# Patient Record
Sex: Male | Born: 1975
Health system: Southern US, Community
[De-identification: ages and names within clinical notes are randomized; demographics above are authoritative.]

## PROBLEM LIST (undated history)

## (undated) DIAGNOSIS — I10 Essential (primary) hypertension: Secondary | ICD-10-CM

---

## 2018-07-30 ENCOUNTER — Encounter (HOSPITAL_COMMUNITY): Payer: Self-pay | Admitting: *Deleted

## 2018-07-30 ENCOUNTER — Other Ambulatory Visit: Payer: Self-pay

## 2018-07-30 ENCOUNTER — Emergency Department (HOSPITAL_COMMUNITY): Payer: BLUE CROSS/BLUE SHIELD

## 2018-07-30 ENCOUNTER — Emergency Department (HOSPITAL_COMMUNITY)
Admission: EM | Admit: 2018-07-30 | Discharge: 2018-07-30 | Disposition: A | Discharge status: Home / Self Care | Payer: BLUE CROSS/BLUE SHIELD | Attending: Emergency Medicine | Admitting: Emergency Medicine

## 2018-07-30 DIAGNOSIS — S20211A Contusion of right front wall of thorax, initial encounter: Secondary | ICD-10-CM | POA: Diagnosis not present

## 2018-07-30 DIAGNOSIS — I10 Essential (primary) hypertension: Secondary | ICD-10-CM | POA: Insufficient documentation

## 2018-07-30 DIAGNOSIS — Y999 Unspecified external cause status: Secondary | ICD-10-CM | POA: Insufficient documentation

## 2018-07-30 DIAGNOSIS — Y9389 Activity, other specified: Secondary | ICD-10-CM | POA: Insufficient documentation

## 2018-07-30 DIAGNOSIS — Y929 Unspecified place or not applicable: Secondary | ICD-10-CM | POA: Insufficient documentation

## 2018-07-30 HISTORY — DX: Essential (primary) hypertension: I10

## 2018-07-30 MED ORDER — IBUPROFEN 800 MG PO TABS: 800.0000 mg | ORAL_TABLET | Freq: Three times a day (TID) | ORAL | 0 refills | Status: AC
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
## 2018-07-30 NOTE — ED Notes (Signed)
Pt stable, ambulatory, states understanding of discharge instructions 

## 2020-03-18 ENCOUNTER — Emergency Department (HOSPITAL_COMMUNITY): Payer: Self-pay

## 2020-03-18 ENCOUNTER — Ambulatory Visit (HOSPITAL_COMMUNITY)
Admission: EM | Admit: 2020-03-18 | Discharge: 2020-03-18 | Disposition: A | Discharge status: Home / Self Care | Payer: Self-pay | Attending: Emergency Medicine | Admitting: Emergency Medicine

## 2020-03-18 ENCOUNTER — Encounter (HOSPITAL_COMMUNITY)
Admission: EM | Disposition: A | Discharge status: Home / Self Care | Payer: Self-pay | Source: Home / Self Care | Attending: Emergency Medicine

## 2020-03-18 ENCOUNTER — Other Ambulatory Visit: Payer: Self-pay

## 2020-03-18 ENCOUNTER — Emergency Department (HOSPITAL_COMMUNITY): Payer: Self-pay | Admitting: Anesthesiology

## 2020-03-18 ENCOUNTER — Encounter (HOSPITAL_COMMUNITY): Payer: Self-pay | Admitting: Certified Registered Nurse Anesthetist

## 2020-03-18 ENCOUNTER — Emergency Department (HOSPITAL_COMMUNITY): Payer: BLUE CROSS/BLUE SHIELD

## 2020-03-18 ENCOUNTER — Emergency Department (HOSPITAL_COMMUNITY)
Admission: EM | Admit: 2020-03-18 | Discharge: 2020-03-18 | Disposition: A | Discharge status: Home / Self Care | Payer: Self-pay | Attending: Emergency Medicine | Admitting: Emergency Medicine

## 2020-03-18 DIAGNOSIS — F10920 Alcohol use, unspecified with intoxication, uncomplicated: Secondary | ICD-10-CM | POA: Insufficient documentation

## 2020-03-18 DIAGNOSIS — Y93I9 Activity, other involving external motion: Secondary | ICD-10-CM | POA: Insufficient documentation

## 2020-03-18 DIAGNOSIS — Y92410 Unspecified street and highway as the place of occurrence of the external cause: Secondary | ICD-10-CM | POA: Insufficient documentation

## 2020-03-18 DIAGNOSIS — Y906 Blood alcohol level of 120-199 mg/100 ml: Secondary | ICD-10-CM | POA: Insufficient documentation

## 2020-03-18 DIAGNOSIS — Z20822 Contact with and (suspected) exposure to covid-19: Secondary | ICD-10-CM | POA: Insufficient documentation

## 2020-03-18 DIAGNOSIS — I10 Essential (primary) hypertension: Secondary | ICD-10-CM | POA: Insufficient documentation

## 2020-03-18 DIAGNOSIS — R519 Headache, unspecified: Secondary | ICD-10-CM | POA: Insufficient documentation

## 2020-03-18 DIAGNOSIS — S21212A Laceration without foreign body of left back wall of thorax without penetration into thoracic cavity, initial encounter: Secondary | ICD-10-CM | POA: Insufficient documentation

## 2020-03-18 DIAGNOSIS — R791 Abnormal coagulation profile: Secondary | ICD-10-CM | POA: Insufficient documentation

## 2020-03-18 DIAGNOSIS — Z79899 Other long term (current) drug therapy: Secondary | ICD-10-CM | POA: Insufficient documentation

## 2020-03-18 DIAGNOSIS — Y999 Unspecified external cause status: Secondary | ICD-10-CM | POA: Insufficient documentation

## 2020-03-18 DIAGNOSIS — T148XXA Other injury of unspecified body region, initial encounter: Secondary | ICD-10-CM

## 2020-03-18 DIAGNOSIS — T1490XA Injury, unspecified, initial encounter: Secondary | ICD-10-CM

## 2020-03-18 HISTORY — PX: WOUND EXPLORATION: SHX6188

## 2020-03-18 LAB — COMPREHENSIVE METABOLIC PANEL
ALT: 85 U/L — ABNORMAL HIGH (ref 0–44)
ALT: 74 U/L — ABNORMAL HIGH (ref 0–44)
AST: 89 U/L — ABNORMAL HIGH (ref 15–41)
AST: 68 U/L — ABNORMAL HIGH (ref 15–41)
Albumin: 4.2 g/dL (ref 3.5–5.0)
Albumin: 3.9 g/dL (ref 3.5–5.0)
Alkaline Phosphatase: 37 U/L — ABNORMAL LOW (ref 38–126)
Alkaline Phosphatase: 33 U/L — ABNORMAL LOW (ref 38–126)
Anion gap: 12 (ref 5–15)
Anion gap: 13 (ref 5–15)
BUN: 14 mg/dL (ref 6–20)
BUN: 12 mg/dL (ref 6–20)
CO2: 20 mmol/L — ABNORMAL LOW (ref 22–32)
CO2: 19 mmol/L — ABNORMAL LOW (ref 22–32)
Calcium: 9.2 mg/dL (ref 8.9–10.3)
Calcium: 8.8 mg/dL — ABNORMAL LOW (ref 8.9–10.3)
Chloride: 107 mmol/L (ref 98–111)
Chloride: 108 mmol/L (ref 98–111)
Creatinine, Ser: 1.17 mg/dL (ref 0.61–1.24)
Creatinine, Ser: 0.99 mg/dL (ref 0.61–1.24)
GFR calc Af Amer: >60 mL/min (ref >60)
GFR calc Af Amer: >60 mL/min (ref >60)
GFR calc non Af Amer: >60 mL/min (ref >60)
GFR calc non Af Amer: >60 mL/min (ref >60)
Glucose, Bld: 124 mg/dL — ABNORMAL HIGH (ref 70–99)
Glucose, Bld: 123 mg/dL — ABNORMAL HIGH (ref 70–99)
Potassium: 3.4 mmol/L — ABNORMAL LOW (ref 3.5–5.1)
Potassium: 3.6 mmol/L (ref 3.5–5.1)
Sodium: 139 mmol/L (ref 135–145)
Sodium: 140 mmol/L (ref 135–145)
Total Bilirubin: 0.8 mg/dL (ref 0.3–1.2)
Total Bilirubin: 0.8 mg/dL (ref 0.3–1.2)
Total Protein: 7.1 g/dL (ref 6.5–8.1)
Total Protein: 6.6 g/dL (ref 6.5–8.1)

## 2020-03-18 LAB — I-STAT CHEM 8, ED
BUN: 16 mg/dL (ref 6–20)
BUN: 11 mg/dL (ref 6–20)
Calcium, Ion: 1.09 mmol/L — ABNORMAL LOW (ref 1.15–1.40)
Calcium, Ion: 0.96 mmol/L — ABNORMAL LOW (ref 1.15–1.40)
Chloride: 109 mmol/L (ref 98–111)
Chloride: 109 mmol/L (ref 98–111)
Creatinine, Ser: 1.3 mg/dL — ABNORMAL HIGH (ref 0.61–1.24)
Creatinine, Ser: 1 mg/dL (ref 0.61–1.24)
Glucose, Bld: 124 mg/dL — ABNORMAL HIGH (ref 70–99)
Glucose, Bld: 120 mg/dL — ABNORMAL HIGH (ref 70–99)
HCT: 45 % (ref 39.0–52.0)
HCT: 40 % (ref 39.0–52.0)
Hemoglobin: 15.3 g/dL (ref 13.0–17.0)
Hemoglobin: 13.6 g/dL (ref 13.0–17.0)
Potassium: 3.4 mmol/L — ABNORMAL LOW (ref 3.5–5.1)
Potassium: 3.5 mmol/L (ref 3.5–5.1)
Sodium: 144 mmol/L (ref 135–145)
Sodium: 142 mmol/L (ref 135–145)
TCO2: 22 mmol/L (ref 22–32)
TCO2: 18 mmol/L — ABNORMAL LOW (ref 22–32)

## 2020-03-18 LAB — POCT I-STAT 7, (LYTES, BLD GAS, ICA,H+H)
Acid-base deficit: 4 mmol/L — ABNORMAL HIGH (ref 0.0–2.0)
Acid-base deficit: 1 mmol/L (ref 0.0–2.0)
Bicarbonate: 23.1 mmol/L (ref 20.0–28.0)
Bicarbonate: 24.4 mmol/L (ref 20.0–28.0)
Calcium, Ion: 1.16 mmol/L (ref 1.15–1.40)
Calcium, Ion: 1.11 mmol/L — ABNORMAL LOW (ref 1.15–1.40)
HCT: 31 % — ABNORMAL LOW (ref 39.0–52.0)
HCT: 26 % — ABNORMAL LOW (ref 39.0–52.0)
Hemoglobin: 10.5 g/dL — ABNORMAL LOW (ref 13.0–17.0)
Hemoglobin: 8.8 g/dL — ABNORMAL LOW (ref 13.0–17.0)
O2 Saturation: 100 %
O2 Saturation: 100 %
Potassium: 4.2 mmol/L (ref 3.5–5.1)
Potassium: 4.1 mmol/L (ref 3.5–5.1)
Sodium: 142 mmol/L (ref 135–145)
Sodium: 144 mmol/L (ref 135–145)
TCO2: 25 mmol/L (ref 22–32)
TCO2: 26 mmol/L (ref 22–32)
pCO2 arterial: 51 mmHg — ABNORMAL HIGH (ref 32.0–48.0)
pCO2 arterial: 41.2 mmHg (ref 32.0–48.0)
pH, Arterial: 7.264 — ABNORMAL LOW (ref 7.350–7.450)
pH, Arterial: 7.381 (ref 7.350–7.450)
pO2, Arterial: 474 mmHg — ABNORMAL HIGH (ref 83.0–108.0)
pO2, Arterial: 190 mmHg — ABNORMAL HIGH (ref 83.0–108.0)

## 2020-03-18 LAB — CBC
HCT: 43.1 % (ref 39.0–52.0)
HCT: 39.6 % (ref 39.0–52.0)
Hemoglobin: 14.7 g/dL (ref 13.0–17.0)
Hemoglobin: 13.2 g/dL (ref 13.0–17.0)
MCH: 31.1 pg (ref 26.0–34.0)
MCH: 30.8 pg (ref 26.0–34.0)
MCHC: 34.1 g/dL (ref 30.0–36.0)
MCHC: 33.3 g/dL (ref 30.0–36.0)
MCV: 91.3 fL (ref 80.0–100.0)
MCV: 92.5 fL (ref 80.0–100.0)
Platelets: 325 10*3/uL (ref 150–400)
Platelets: 305 10*3/uL (ref 150–400)
RBC: 4.72 MIL/uL (ref 4.22–5.81)
RBC: 4.28 MIL/uL (ref 4.22–5.81)
RDW: 12.4 % (ref 11.5–15.5)
RDW: 12.6 % (ref 11.5–15.5)
WBC: 4.7 10*3/uL (ref 4.0–10.5)
WBC: 4.5 10*3/uL (ref 4.0–10.5)
nRBC: 0 % (ref 0.0–0.2)
nRBC: 0 % (ref 0.0–0.2)

## 2020-03-18 LAB — LACTIC ACID, PLASMA: Lactic Acid, Venous: 2.1 mmol/L (ref 0.5–1.9)

## 2020-03-18 LAB — ETHANOL
Alcohol, Ethyl (B): 167 mg/dL — ABNORMAL HIGH (ref <10)
Alcohol, Ethyl (B): 26 mg/dL — ABNORMAL HIGH (ref <10)

## 2020-03-18 LAB — PROTIME-INR
INR: 1 (ref 0.8–1.2)
INR: 1.1 (ref 0.8–1.2)
Prothrombin Time: 12.5 seconds (ref 11.4–15.2)
Prothrombin Time: 13.3 seconds (ref 11.4–15.2)

## 2020-03-18 LAB — SAMPLE TO BLOOD BANK

## 2020-03-18 LAB — SARS CORONAVIRUS 2 BY RT PCR (HOSPITAL ORDER, PERFORMED IN ~~LOC~~ HOSPITAL LAB): SARS Coronavirus 2: NEGATIVE

## 2020-03-18 LAB — ABO/RH: ABO/RH(D): AB POS

## 2020-03-18 SURGERY — WOUND EXPLORATION
Anesthesia: General | Site: Back

## 2020-03-18 MED ORDER — ACETAMINOPHEN 10 MG/ML IV SOLN: 1000.0000 mg | Freq: Once | INTRAVENOUS | Status: DC | PRN

## 2020-03-18 MED ORDER — MIDAZOLAM HCL 2 MG/2ML IJ SOLN
INTRAMUSCULAR | Status: AC
  Filled 2020-03-18: qty 2

## 2020-03-18 MED ORDER — CEFAZOLIN SODIUM-DEXTROSE 2-4 GM/100ML-% IV SOLN
2.0000 g | Freq: Once | INTRAVENOUS | Status: AC
  Administered 2020-03-18: 2 g via INTRAVENOUS

## 2020-03-18 MED ORDER — SUCCINYLCHOLINE CHLORIDE 200 MG/10ML IV SOSY
PREFILLED_SYRINGE | INTRAVENOUS | Status: DC | PRN
  Administered 2020-03-18: 120 mg via INTRAVENOUS

## 2020-03-18 MED ORDER — LIDOCAINE 2% (20 MG/ML) 5 ML SYRINGE
INTRAMUSCULAR | Status: DC | PRN
  Administered 2020-03-18: 60 mg via INTRAVENOUS

## 2020-03-18 MED ORDER — PROPOFOL 10 MG/ML IV BOLUS
INTRAVENOUS | Status: AC
  Filled 2020-03-18: qty 20

## 2020-03-18 MED ORDER — OXYCODONE HCL 5 MG PO TABS: 5.0000 mg | ORAL_TABLET | Freq: Once | ORAL | Status: DC | PRN

## 2020-03-18 MED ORDER — PROMETHAZINE HCL 25 MG/ML IJ SOLN: 6.2500 mg | INTRAMUSCULAR | Status: DC | PRN

## 2020-03-18 MED ORDER — OXYCODONE HCL 5 MG/5ML PO SOLN: 5.0000 mg | Freq: Once | ORAL | Status: DC | PRN

## 2020-03-18 MED ORDER — KETOROLAC TROMETHAMINE 30 MG/ML IJ SOLN
30.0000 mg | Freq: Once | INTRAMUSCULAR | Status: AC
  Administered 2020-03-18: 30 mg via INTRAVENOUS

## 2020-03-18 MED ORDER — DEXAMETHASONE SODIUM PHOSPHATE 10 MG/ML IJ SOLN
INTRAMUSCULAR | Status: AC
  Filled 2020-03-18: qty 1

## 2020-03-18 MED ORDER — FENTANYL CITRATE (PF) 100 MCG/2ML IJ SOLN
INTRAMUSCULAR | Status: AC | PRN
  Administered 2020-03-18: 50 ug via INTRAVENOUS

## 2020-03-18 MED ORDER — ROCURONIUM BROMIDE 10 MG/ML (PF) SYRINGE
PREFILLED_SYRINGE | INTRAVENOUS | Status: AC
  Filled 2020-03-18: qty 10

## 2020-03-18 MED ORDER — LABETALOL HCL 5 MG/ML IV SOLN
5.0000 mg | INTRAVENOUS | Status: DC | PRN
  Administered 2020-03-18 (×3): 5 mg via INTRAVENOUS

## 2020-03-18 MED ORDER — ALBUMIN HUMAN 5 % IV SOLN: INTRAVENOUS | Status: DC | PRN

## 2020-03-18 MED ORDER — SODIUM CHLORIDE 0.9 % IV SOLN
INTRAVENOUS | Status: AC | PRN
  Administered 2020-03-18: 1000 mL via INTRAVENOUS

## 2020-03-18 MED ORDER — KETOROLAC TROMETHAMINE 30 MG/ML IJ SOLN
INTRAMUSCULAR | Status: AC
  Filled 2020-03-18: qty 1

## 2020-03-18 MED ORDER — SODIUM BICARBONATE 8.4 % IV SOLN
INTRAVENOUS | Status: AC
  Filled 2020-03-18: qty 50

## 2020-03-18 MED ORDER — SUCCINYLCHOLINE CHLORIDE 200 MG/10ML IV SOSY
PREFILLED_SYRINGE | INTRAVENOUS | Status: AC
  Filled 2020-03-18: qty 10

## 2020-03-18 MED ORDER — IOHEXOL 300 MG/ML  SOLN
75.0000 mL | Freq: Once | INTRAMUSCULAR | Status: AC | PRN
  Administered 2020-03-18: 75 mL via INTRAVENOUS

## 2020-03-18 MED ORDER — LABETALOL HCL 5 MG/ML IV SOLN
INTRAVENOUS | Status: AC
  Filled 2020-03-18: qty 4

## 2020-03-18 MED ORDER — OXYCODONE HCL 5 MG PO TABS: 5.0000 mg | ORAL_TABLET | Freq: Four times a day (QID) | ORAL | 0 refills | Status: AC | PRN

## 2020-03-18 MED ORDER — DEXAMETHASONE SODIUM PHOSPHATE 10 MG/ML IJ SOLN
INTRAMUSCULAR | Status: DC | PRN
  Administered 2020-03-18: 10 mg via INTRAVENOUS

## 2020-03-18 MED ORDER — LACTATED RINGERS IV SOLN: INTRAVENOUS | Status: DC | PRN

## 2020-03-18 MED ORDER — HYDROMORPHONE HCL 1 MG/ML IJ SOLN
INTRAMUSCULAR | Status: AC
  Administered 2020-03-18: 0.5 mg
  Filled 2020-03-18: qty 1

## 2020-03-18 MED ORDER — LISINOPRIL 10 MG PO TABS
10.0000 mg | ORAL_TABLET | Freq: Every day | ORAL | 0 refills | Status: AC
Start: 2020-03-18 — End: 2020-04-17

## 2020-03-18 MED ORDER — HYDROMORPHONE HCL 1 MG/ML IJ SOLN: 0.2500 mg | INTRAMUSCULAR | Status: DC | PRN

## 2020-03-18 MED ORDER — PROPOFOL 10 MG/ML IV BOLUS
INTRAVENOUS | Status: DC | PRN
  Administered 2020-03-18 (×2): 50 mg via INTRAVENOUS
  Administered 2020-03-18: 150 mg via INTRAVENOUS

## 2020-03-18 MED ORDER — ONDANSETRON HCL 4 MG/2ML IJ SOLN
INTRAMUSCULAR | Status: AC
  Filled 2020-03-18: qty 2

## 2020-03-18 MED ORDER — FENTANYL CITRATE (PF) 100 MCG/2ML IJ SOLN
INTRAMUSCULAR | Status: DC | PRN
  Administered 2020-03-18: 150 ug via INTRAVENOUS
  Administered 2020-03-18: 100 ug via INTRAVENOUS
  Administered 2020-03-18: 50 ug via INTRAVENOUS

## 2020-03-18 MED ORDER — ESMOLOL HCL 100 MG/10ML IV SOLN
INTRAVENOUS | Status: AC
  Filled 2020-03-18: qty 10

## 2020-03-18 MED ORDER — ONDANSETRON HCL 4 MG/2ML IJ SOLN
INTRAMUSCULAR | Status: DC | PRN
  Administered 2020-03-18: 4 mg via INTRAVENOUS

## 2020-03-18 MED ORDER — ROCURONIUM BROMIDE 10 MG/ML (PF) SYRINGE
PREFILLED_SYRINGE | INTRAVENOUS | Status: DC | PRN
  Administered 2020-03-18: 60 mg via INTRAVENOUS

## 2020-03-18 MED ORDER — SODIUM CHLORIDE 0.9 % IV BOLUS
1000.0000 mL | Freq: Once | INTRAVENOUS | Status: AC
  Administered 2020-03-18: 1000 mL via INTRAVENOUS

## 2020-03-18 MED ORDER — LIDOCAINE 2% (20 MG/ML) 5 ML SYRINGE
INTRAMUSCULAR | Status: AC
  Filled 2020-03-18: qty 5

## 2020-03-18 MED ORDER — SUGAMMADEX SODIUM 200 MG/2ML IV SOLN
INTRAVENOUS | Status: DC | PRN
  Administered 2020-03-18: 200 mg via INTRAVENOUS

## 2020-03-18 MED ORDER — 0.9 % SODIUM CHLORIDE (POUR BTL) OPTIME
TOPICAL | Status: DC | PRN
  Administered 2020-03-18: 1000 mL

## 2020-03-18 MED ORDER — TETANUS-DIPHTH-ACELL PERTUSSIS 5-2.5-18.5 LF-MCG/0.5 IM SUSP
0.5000 mL | Freq: Once | INTRAMUSCULAR | Status: AC
  Administered 2020-03-18: 0.5 mL via INTRAMUSCULAR

## 2020-03-18 MED ORDER — FENTANYL CITRATE (PF) 250 MCG/5ML IJ SOLN
INTRAMUSCULAR | Status: AC
  Filled 2020-03-18: qty 5

## 2020-03-18 MED ORDER — ESMOLOL HCL 100 MG/10ML IV SOLN
INTRAVENOUS | Status: DC | PRN
Start: 2020-03-18 — End: 2020-03-18
  Administered 2020-03-18: 30 mg via INTRAVENOUS
  Administered 2020-03-18: 20 mg via INTRAVENOUS

## 2020-03-18 MED ORDER — SODIUM BICARBONATE 8.4 % IV SOLN
INTRAVENOUS | Status: DC | PRN
  Administered 2020-03-18: 50 meq via INTRAVENOUS

## 2020-03-18 MED FILL — LISINOPRIL 10 MG TABS: 10 | 30 days supply | Qty: 30 | Fill #0

## 2020-03-18 MED FILL — oxyCODONE HCL 5 MG TABS: 5 | 4 days supply | Qty: 15 | Fill #0

## 2020-03-18 SURGICAL SUPPLY — 48 items
BLADE CLIPPER SURG (BLADE) IMPLANT
CANISTER SUCT 3000ML PPV (MISCELLANEOUS) ×3 IMPLANT
CHLORAPREP W/TINT 26 (MISCELLANEOUS) IMPLANT
COVER SURGICAL LIGHT HANDLE (MISCELLANEOUS) IMPLANT
DRAPE UNIVERSAL (DRAPES) ×6 IMPLANT
DRAPE WARM FLUID 44X44 (DRAPES) ×3 IMPLANT
DRSG OPSITE POSTOP 4X10 (GAUZE/BANDAGES/DRESSINGS) IMPLANT
DRSG OPSITE POSTOP 4X8 (GAUZE/BANDAGES/DRESSINGS) IMPLANT
ELECT BLADE 4.0 EZ CLEAN MEGAD (MISCELLANEOUS) ×3
ELECT BLADE 6.5 EXT (BLADE) IMPLANT
ELECT CAUTERY BLADE 6.4 (BLADE) ×3 IMPLANT
ELECT REM PT RETURN 9FT ADLT (ELECTROSURGICAL) ×3
ELECTRODE BLDE 4.0 EZ CLN MEGD (MISCELLANEOUS) ×2 IMPLANT
ELECTRODE REM PT RTRN 9FT ADLT (ELECTROSURGICAL) ×2 IMPLANT
GAUZE PACKING IODOFORM 1X5 (PACKING) ×3 IMPLANT
GAUZE SPONGE 4X4 12PLY STRL (GAUZE/BANDAGES/DRESSINGS) ×3 IMPLANT
GLOVE BIO SURGEON STRL SZ 6.5 (GLOVE) ×3 IMPLANT
GLOVE BIOGEL PI IND STRL 6 (GLOVE) ×2 IMPLANT
GLOVE BIOGEL PI INDICATOR 6 (GLOVE) ×1
GOWN STRL REUS W/ TWL LRG LVL3 (GOWN DISPOSABLE) ×4 IMPLANT
GOWN STRL REUS W/TWL LRG LVL3 (GOWN DISPOSABLE) ×2
HANDLE SUCTION POOLE (INSTRUMENTS) ×2 IMPLANT
KIT BASIN OR (CUSTOM PROCEDURE TRAY) ×3 IMPLANT
KIT TURNOVER KIT B (KITS) ×3 IMPLANT
LIGASURE IMPACT 36 18CM CVD LR (INSTRUMENTS) IMPLANT
NS IRRIG 1000ML POUR BTL (IV SOLUTION) ×6 IMPLANT
PACK GENERAL/GYN (CUSTOM PROCEDURE TRAY) ×3 IMPLANT
PAD ABD 7.5X8 STRL (GAUZE/BANDAGES/DRESSINGS) ×3 IMPLANT
PAD ARMBOARD 7.5X6 YLW CONV (MISCELLANEOUS) ×3 IMPLANT
PENCIL SMOKE EVACUATOR (MISCELLANEOUS) ×3 IMPLANT
SPONGE LAP 18X18 RF (DISPOSABLE) ×3 IMPLANT
STAPLER VISISTAT 35W (STAPLE) IMPLANT
SUCTION POOLE HANDLE (INSTRUMENTS) ×3
SUT ETHILON 2 0 FS 18 (SUTURE) ×9 IMPLANT
SUT PDS AB 1 TP1 54 (SUTURE) IMPLANT
SUT PDS AB 1 TP1 96 (SUTURE) IMPLANT
SUT SILK 2 0 SH CR/8 (SUTURE) ×3 IMPLANT
SUT SILK 2 0 TIES 10X30 (SUTURE) ×6 IMPLANT
SUT SILK 3 0 SH CR/8 (SUTURE) ×3 IMPLANT
SUT SILK 3 0 TIES 10X30 (SUTURE) ×6 IMPLANT
SUT VIC AB 2-0 CT1 27 (SUTURE) ×1
SUT VIC AB 2-0 CT1 TAPERPNT 27 (SUTURE) ×2 IMPLANT
SUT VIC AB 3-0 SH 18 (SUTURE) IMPLANT
TAPE CLOTH SURG 6X10 WHT LF (GAUZE/BANDAGES/DRESSINGS) ×3 IMPLANT
TOWEL GREEN STERILE (TOWEL DISPOSABLE) ×3 IMPLANT
TRAY FOLEY MTR SLVR 16FR STAT (SET/KITS/TRAYS/PACK) IMPLANT
TRAY FOLEY W/BAG SLVR 14FR (SET/KITS/TRAYS/PACK) ×3 IMPLANT
YANKAUER SUCT BULB TIP NO VENT (SUCTIONS) ×3 IMPLANT
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
## 2020-03-18 NOTE — Anesthesia Postprocedure Evaluation (Signed)
Anesthesia Post Note  Patient: Jesus Powell  Procedure(s) Performed: WOUND EXPLORATION (Left Back)     Patient location during evaluation: PACU Anesthesia Type: General Level of consciousness: awake Pain management: pain level controlled Vital Signs Assessment: post-procedure vital signs reviewed and stable Respiratory status: spontaneous breathing, nonlabored ventilation, respiratory function stable and patient connected to nasal cannula oxygen Cardiovascular status: blood pressure returned to baseline and stable Postop Assessment: no apparent nausea or vomiting Anesthetic complications: no   No complications documented.  Last Vitals:  Vitals:   03/18/20 1630 03/18/20 1645  BP: (!) 149/105 (!) 148/108  Pulse: 93 92  Resp: (!) 27 (!) 25  Temp:  (!) 36.3 C  SpO2: 99% 99%    Last Pain:  Vitals:   03/18/20 1630  TempSrc:   PainSc: 3                  Sejal Cofield P Oluwadamilola Rosamond

## 2020-03-18 NOTE — ED Notes (Signed)
Pt transported to CT ?
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
## 2020-03-18 NOTE — Transfer of Care (Signed)
Immediate Anesthesia Transfer of Care Note  Patient: Jesus Powell  Procedure(s) Performed: WOUND EXPLORATION (Left Back)  Patient Location: PACU  Anesthesia Type:General  Level of Consciousness: drowsy and patient cooperative  Airway & Oxygen Therapy: Patient Spontanous Breathing and Patient connected to face mask oxygen  Post-op Assessment: Report given to RN and Post -op Vital signs reviewed and stable  Post vital signs: Reviewed and stable  Last Vitals:  Vitals Value Taken Time  BP 160/119 03/18/20 1241  Temp 36.1 C 03/18/20 1239  Pulse 105 03/18/20 1245  Resp 20 03/18/20 1245  SpO2 95 % 03/18/20 1245  Vitals shown include unvalidated device data.  Last Pain:  Vitals:   03/18/20 1032  TempSrc: Tympanic  PainSc:          Complications: No complications documented.

## 2020-03-18 NOTE — Anesthesia Procedure Notes (Signed)
Arterial Line Insertion Start/End7/03/2020 10:44 AM Performed by: Rosalio Macadamia, CRNA, CRNA  Patient location: Pre-op. Preanesthetic checklist: patient identified, IV checked, site marked, risks and benefits discussed, surgical consent, monitors and equipment checked, pre-op evaluation, timeout performed and anesthesia consent Lidocaine 1% used for infiltration Left, radial was placed Catheter size: 20 G Hand hygiene performed  and maximum sterile barriers used   Attempts: 1 Procedure performed without using ultrasound guided technique. Following insertion, dressing applied and Biopatch. Post procedure assessment: normal and unchanged  Patient tolerated the procedure well with no immediate complications.
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
## 2020-03-18 NOTE — Discharge Instructions (Addendum)
Wound care: Change dry dressing daily and as needed for saturation. Remove packing strip on 03/20/2020. Ok to shower with wound open after removing packing. Call with concerns (ie increased drainage, redness, fever...). Follow up next week for wound check and suture removal.   Laceration Care, Adult A laceration is a cut that may go through all layers of the skin. The cut may also go into the tissue that is right under the skin. Some cuts heal on their own. Others need to be closed with stitches (sutures), staples, skin adhesive strips, or skin glue. Taking care of your injury lowers your risk of infection, helps your injury to heal better, and may prevent scarring. Supplies needed:  Soap.  Water.  Hand sanitizer.  Bandage (dressing).  Antibiotic ointment.  Clean towel. How to take care of your cut Wash your hands with soap and water before touching your wound or changing your bandage. If soap and water are not available, use hand sanitizer. If your doctor used stitches or staples:  Keep the wound clean and dry.  If you were given a bandage, change it at least once a day as told by your doctor. You should also change it if it gets wet or dirty.  Keep the wound completely dry for the first 24 hours, or as told by your doctor. After that, you may take a shower or a bath. Do not get the wound soaked in water until after the stitches or staples have been removed.  Clean the wound once a day, or as told by your doctor: ? Wash the wound with soap and water. ? Rinse the wound with water to remove all soap. ? Pat the wound dry with a clean towel. Do not rub the wound.  After you clean the wound, put a thin layer of antibiotic ointment on it as told by your doctor. This ointment: ? Helps to prevent infection. ? Keeps the bandage from sticking to the wound.  Have your stitches or staples removed as told by your doctor. If your doctor used skin adhesive strips:  Keep the wound clean and  dry.  If you were given a bandage, you should change it at least once a day as told by your doctor. You should also change it if it gets wet or dirty.  Do not get the skin adhesive strips wet. You can take a shower or a bath, but keep the wound dry.  If the wound gets wet, pat it dry with a clean towel. Do not rub the wound.  Skin adhesive strips fall off on their own. You can trim the strips as the wound heals. Do not remove any strips that are still stuck to the wound. They will fall off after a while. If your doctor used skin glue:  Try to keep your wound dry, but you may briefly wet it in the shower or bath. Do not soak the wound in water, such as by swimming.  After you take a shower or a bath, gently pat the wound dry with a clean towel. Do not rub the wound.  Do not do any activities that will make you really sweaty until the skin glue has fallen off on its own.  Do not apply liquid, cream, or ointment medicine to your wound while the skin glue is still on.  If you were given a bandage, you should change it at least once a day or as told by your doctor. You should also change it if it  gets dirty or wet.  If a bandage is placed over the wound, do not let the tape touch the skin glue.  Do not pick at the glue. The skin glue usually stays on for 5-10 days. Then, it falls off the skin. General instructions   Take over-the-counter and prescription medicines only as told by your doctor.  If you were given antibiotic medicine or ointment, take or apply it as told by your doctor. Do not stop using it even if your condition improves.  Do not scratch or pick at the wound.  Check your wound every day for signs of infection. Watch for: ? Redness, swelling, or pain. ? Fluid, blood, or pus.  Raise (elevate) the injured area above the level of your heart while you are sitting or lying down.  If directed, put ice on the affected area: ? Put ice in a plastic bag. ? Place a towel  between your skin and the bag. ? Leave the ice on for 20 minutes, 2-3 times a day.  Prevent scarring by covering your wound with sunscreen of at least 30 SPF whenever you are outside after your wound has healed.  Keep all follow-up visits as told by your doctor. This is important. Get help if:  You got a tetanus shot and you have any of these problems at the injection site: ? Swelling. ? Very bad pain. ? Redness. ? Bleeding.  You have a fever.  A wound that was closed breaks open.  You notice a bad smell coming from your wound or your bandage.  You notice something coming out of the wound, such as wood or glass.  Medicine does not relieve your pain.  You have more redness, swelling, or pain at the site of your wound.  You have fluid, blood, or pus coming from your wound.  You notice a change in the color of your skin near your wound.  You need to change the bandage often because fluid, blood, or pus is coming from the wound.  You start to have a new rash.  You start to have numbness around the wound. Get help right away if:  You have very bad swelling around the wound.  Your pain suddenly gets worse and is very bad.  You notice painful lumps near the wound or anywhere on your body.  You have a red streak going away from your wound.  The wound is on your hand or foot, and: ? You cannot move a finger or toe. ? Your fingers or toes look pale or bluish. Summary  A laceration is a cut that may go through all layers of the skin. The cut may also go into the tissue right under the skin.  Some cuts heal on their own. Others need to be closed with stitches, staples, skin adhesive strips, or skin glue.  Follow your doctor's instructions for caring for your cut. Proper care of a cut lowers the risk of infection, helps the cut heal better, and prevents scarring. This information is not intended to replace advice given to you by your health care provider. Make sure you  discuss any questions you have with your health care provider. Document Revised: 10/27/2017 Document Reviewed: 09/18/2017 Elsevier Patient Education  2020 [employer].    Hypertension, Adult Hypertension is another name for high blood pressure. High blood pressure forces your heart to work harder to pump blood. This can cause problems over time. There are two numbers in a blood pressure reading. There is  a top number (systolic) over a bottom number (diastolic). It is best to have a blood pressure that is below 120/80. Healthy choices can help lower your blood pressure, or you may need medicine to help lower it. What are the causes? The cause of this condition is not known. Some conditions may be related to high blood pressure. What increases the risk?  Smoking.  Having type 2 diabetes mellitus, high cholesterol, or both.  Not getting enough exercise or physical activity.  Being overweight.  Having too much fat, sugar, calories, or salt (sodium) in your diet.  Drinking too much alcohol.  Having long-term (chronic) kidney disease.  Having a family history of high blood pressure.  Age. Risk increases with age.  Race. You may be at higher risk if you are African American.  Gender. Men are at higher risk than women before age 8. After age 53, women are at higher risk than men.  Having obstructive sleep apnea.  Stress. What are the signs or symptoms?  High blood pressure may not cause symptoms. Very high blood pressure (hypertensive crisis) may cause: ? Headache. ? Feelings of worry or nervousness (anxiety). ? Shortness of breath. ? Nosebleed. ? A feeling of being sick to your stomach (nausea). ? Throwing up (vomiting). ? Changes in how you see. ? Very bad chest pain. ? Seizures. How is this treated?  This condition is treated by making healthy lifestyle changes, such as: ? Eating healthy foods. ? Exercising more. ? Drinking less alcohol.  Your health care  provider may prescribe medicine if lifestyle changes are not enough to get your blood pressure under control, and if: ? Your top number is above 130. ? Your bottom number is above 80.  Your personal target blood pressure may vary. Follow these instructions at home: Eating and drinking   If told, follow the DASH eating plan. To follow this plan: ? Fill one half of your plate at each meal with fruits and vegetables. ? Fill one fourth of your plate at each meal with whole grains. Whole grains include whole-wheat pasta, brown rice, and whole-grain bread. ? Eat or drink low-fat dairy products, such as skim milk or low-fat yogurt. ? Fill one fourth of your plate at each meal with low-fat (lean) proteins. Low-fat proteins include fish, chicken without skin, eggs, beans, and tofu. ? Avoid fatty meat, cured and processed meat, or chicken with skin. ? Avoid pre-made or processed food.  Eat less than 1,500 mg of salt each day.  Do not drink alcohol if: ? Your doctor tells you not to drink. ? You are pregnant, may be pregnant, or are planning to become pregnant.  If you drink alcohol: ? Limit how much you use to:  0-1 drink a day for women.  0-2 drinks a day for men. ? Be aware of how much alcohol is in your drink. In the [place] one drink equals one 12 oz bottle of beer (355 mL), one 5 oz glass of wine (148 mL), or one 1 oz glass of hard liquor (44 mL). Lifestyle   Work with your doctor to stay at a healthy weight or to lose weight. Ask your doctor what the best weight is for you.  Get at least 30 minutes of exercise most days of the week. This may include walking, swimming, or biking.  Get at least 30 minutes of exercise that strengthens your muscles (resistance exercise) at least 3 days a week. This may include lifting weights or doing Pilates.  Do not use any products that contain nicotine or tobacco, such as cigarettes, e-cigarettes, and chewing tobacco. If you need help quitting, ask  your doctor.  Check your blood pressure at home as told by your doctor.  Keep all follow-up visits as told by your doctor. This is important. Medicines  Take over-the-counter and prescription medicines only as told by your doctor. Follow directions carefully.  Do not skip doses of blood pressure medicine. The medicine does not work as well if you skip doses. Skipping doses also puts you at risk for problems.  Ask your doctor about side effects or reactions to medicines that you should watch for. Contact a doctor if you:  Think you are having a reaction to the medicine you are taking.  Have headaches that keep coming back (recurring).  Feel dizzy.  Have swelling in your ankles.  Have trouble with your vision. Get help right away if you:  Get a very bad headache.  Start to feel mixed up (confused).  Feel weak or numb.  Feel faint.  Have very bad pain in your: ? Chest. ? Belly (abdomen).  Throw up more than once.  Have trouble breathing. Summary  Hypertension is another name for high blood pressure.  High blood pressure forces your heart to work harder to pump blood.  For most people, a normal blood pressure is less than 120/80.  Making healthy choices can help lower blood pressure. If your blood pressure does not get lower with healthy choices, you may need to take medicine. This information is not intended to replace advice given to you by your health care provider. Make sure you discuss any questions you have with your health care provider. Document Revised: 05/09/2018 Document Reviewed: 05/09/2018 Elsevier Patient Education  2020 [employer].
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
## 2020-03-18 NOTE — ED Notes (Signed)
Pt tolerating water; ambulatory around room with steady gait

## 2020-03-18 NOTE — ED Notes (Signed)
Pt has watch, wallet, jeans, cigarettes, a belt, a pair of slides and socks in belongings bag. Pt has approved [employer] to take it for [facility]

## 2020-03-18 NOTE — Anesthesia Procedure Notes (Signed)
Procedure Name: Intubation Date/Time: 03/18/2020 10:51 AM Performed by: Candis Shine, CRNA Pre-anesthesia Checklist: Patient identified, Emergency Drugs available, Suction available and Patient being monitored Patient Re-evaluated:Patient Re-evaluated prior to induction Oxygen Delivery Method: Circle System Utilized Preoxygenation: Pre-oxygenation with 100% oxygen Induction Type: IV induction and Rapid sequence Laryngoscope Size: Glidescope and 4 Grade View: Grade II Tube type: Oral Tube size: 8.0 mm Number of attempts: 2 Airway Equipment and Method: Video-laryngoscopy and Rigid stylet Placement Confirmation: ETT inserted through vocal cords under direct vision,  positive ETCO2 and breath sounds checked- equal and bilateral Secured at: 24 cm Tube secured with: Tape Dental Injury: Teeth and Oropharynx as per pre-operative assessment  Difficulty Due To: Difficulty was unanticipated Comments: DL x 1 with Mac 4, grade III view. Bougie unsuccessful. Intubation with glidescope 4, grade II view.

## 2020-03-18 NOTE — ED Notes (Signed)
Pt concerned about son being in the car. Off-duty officer able to follow up that no child was in patient's car. Also attempted to call pt's mother multiple times without success
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
## 2020-03-18 NOTE — Anesthesia Procedure Notes (Signed)
Central Venous Catheter Insertion Performed by: Leonides Grills, MD, anesthesiologist Start/End7/03/2020 10:50 AM, 03/18/2020 11:00 AM Patient location: Pre-op. Preanesthetic checklist: patient identified, IV checked, site marked, risks and benefits discussed, surgical consent, monitors and equipment checked, pre-op evaluation, timeout performed and anesthesia consent Position: Trendelenburg Lidocaine 1% used for infiltration and patient sedated Hand hygiene performed , maximum sterile barriers used  and Seldinger technique used Catheter size: 8 Fr Total catheter length 16. Central line was placed.Double lumen Procedure performed using ultrasound guided technique. Ultrasound Notes:anatomy identified, needle tip was noted to be adjacent to the nerve/plexus identified, no ultrasound evidence of intravascular and/or intraneural injection and image(s) printed for medical record Attempts: 1 Following insertion, dressing applied, line sutured and Biopatch. Post procedure assessment: blood return through all ports, free fluid flow and no air  Patient tolerated the procedure well with no immediate complications.

## 2020-03-18 NOTE — ED Notes (Signed)
Pt in hallway requesting to be discharged. Unable to get in touch with mother. Attempted to call pt's cellphone to reach a ride for discharged, no answer. Offered bus ticket, pt states he doesn't know how to use the bus system. Taxi voucher provided with [employer]. Pt continues to ask about where his car is and where his son is (off duty [employer] officer verified no child was in car with patient). Pt become frustrated that no information could be provided. No wallet or cellphone in room; pt wearing his tanktop and boxers. Discharged to lobby, front desk staff made aware.
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
## 2020-03-19 ENCOUNTER — Encounter (HOSPITAL_COMMUNITY): Payer: Self-pay | Admitting: Surgery

## 2020-03-19 ENCOUNTER — Encounter (HOSPITAL_COMMUNITY): Payer: Self-pay | Admitting: *Deleted

## 2020-03-19 LAB — TYPE AND SCREEN
ABO/RH(D): AB POS
Antibody Screen: NEGATIVE
Unit division: 0

## 2020-03-19 LAB — BPAM RBC
Blood Product Expiration Date: 202108022359
ISSUE DATE / TIME: 202107071018
Unit Type and Rh: 5100
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
